# Patient Record
Sex: Male | Born: 2005 | Race: Black or African American | Hispanic: No
Health system: Southern US, Community
[De-identification: ages and names within clinical notes are randomized; demographics above are authoritative.]

## PROBLEM LIST (undated history)

## (undated) DIAGNOSIS — T7840XA Allergy, unspecified, initial encounter: Secondary | ICD-10-CM

## (undated) DIAGNOSIS — F4310 Post-traumatic stress disorder, unspecified: Secondary | ICD-10-CM

---
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
## 2024-01-16 ENCOUNTER — Telehealth: Payer: Self-pay

## 2024-01-16 NOTE — Telephone Encounter (Signed)
 Copied from CRM #[id number]. Topic: Medical Record Request - Attorney/Litigation >> Jan 16, 2024  1:01 PM Rosaria E wrote: Reason for CRM: Miracle Mease called requesting Medical Records covering that she needs for his lawyer. She says she needs this urgently. Says he goes to court on Wednesday  Best contact: [phone]

## 2024-01-16 NOTE — Telephone Encounter (Signed)
 Will fax to medical records as urgent

## 2024-01-20 ENCOUNTER — Ambulatory Visit: Payer: Self-pay

## 2024-02-07 ENCOUNTER — Ambulatory Visit

## 2024-03-26 ENCOUNTER — Other Ambulatory Visit: Payer: Self-pay

## 2024-03-26 ENCOUNTER — Emergency Department (HOSPITAL_COMMUNITY)

## 2024-03-26 ENCOUNTER — Encounter (HOSPITAL_COMMUNITY): Payer: Self-pay

## 2024-03-26 ENCOUNTER — Ambulatory Visit: Payer: Self-pay

## 2024-03-26 ENCOUNTER — Emergency Department (HOSPITAL_COMMUNITY)
Admission: EM | Admit: 2024-03-26 | Discharge: 2024-03-26 | Disposition: A | Discharge status: Home / Self Care | Attending: Emergency Medicine | Admitting: Emergency Medicine

## 2024-03-26 DIAGNOSIS — L089 Local infection of the skin and subcutaneous tissue, unspecified: Secondary | ICD-10-CM | POA: Insufficient documentation

## 2024-03-26 DIAGNOSIS — F4310 Post-traumatic stress disorder, unspecified: Secondary | ICD-10-CM

## 2024-03-26 MED ORDER — CEPHALEXIN 500 MG PO CAPS: 500.0000 mg | ORAL_CAPSULE | Freq: Two times a day (BID) | ORAL | 0 refills | Status: AC

## 2024-03-26 MED ORDER — CEPHALEXIN 500 MG PO CAPS
500.0000 mg | ORAL_CAPSULE | Freq: Once | ORAL | Status: AC
  Administered 2024-03-26: 500 mg via ORAL
  Filled 2024-03-26: qty 1

## 2024-03-26 MED ORDER — GABAPENTIN 300 MG PO CAPS: 300.0000 mg | ORAL_CAPSULE | Freq: Three times a day (TID) | ORAL | 0 refills | Status: AC

## 2024-03-26 MED ORDER — ESCITALOPRAM OXALATE 10 MG PO TABS: 10.0000 mg | ORAL_TABLET | Freq: Every day | ORAL | 0 refills | Status: AC
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
## 2024-03-26 NOTE — Discharge Instructions (Addendum)
 With your foot infection is important to follow-up with your primary care physician, our podiatry colleagues and our orthopedic colleagues.  As discussed, obtain and use the antibiotics for the next 5 days, as well as Epsom salt soaks.  Return here for concerning changes in your condition.
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
## 2024-03-26 NOTE — ED Notes (Signed)
 ED Provider at bedside.
